# Patient Record
Sex: Male | Born: 1955 | Race: White | Hispanic: No | Marital: Married | Smoking: Former smoker
Health system: Southern US, Community
[De-identification: ages and names within clinical notes are randomized; demographics above are authoritative.]

## PROBLEM LIST (undated history)

## (undated) DIAGNOSIS — D6859 Other primary thrombophilia: Secondary | ICD-10-CM

## (undated) DIAGNOSIS — R760 Raised antibody titer: Secondary | ICD-10-CM

## (undated) DIAGNOSIS — I81 Portal vein thrombosis: Secondary | ICD-10-CM

## (undated) HISTORY — DX: Other primary thrombophilia: D68.59

## (undated) HISTORY — PX: COLON SURGERY: SHX602

## (undated) HISTORY — DX: Raised antibody titer: R76.0

## (undated) HISTORY — DX: Portal vein thrombosis: I81

---

## 2003-09-02 ENCOUNTER — Emergency Department (HOSPITAL_COMMUNITY): Admission: EM | Admit: 2003-09-02 | Discharge: 2003-09-02 | Payer: Self-pay | Admitting: Family Medicine

## 2003-09-17 ENCOUNTER — Emergency Department (HOSPITAL_COMMUNITY): Admission: EM | Admit: 2003-09-17 | Discharge: 2003-09-17 | Payer: Self-pay | Admitting: Emergency Medicine

## 2003-09-18 ENCOUNTER — Inpatient Hospital Stay (HOSPITAL_COMMUNITY): Admission: AD | Admit: 2003-09-18 | Discharge: 2003-09-20 | Payer: Self-pay | Admitting: Orthopaedic Surgery

## 2003-09-21 ENCOUNTER — Encounter (HOSPITAL_COMMUNITY): Admission: RE | Admit: 2003-09-21 | Discharge: 2003-10-21 | Payer: Self-pay | Admitting: Orthopaedic Surgery

## 2003-11-06 ENCOUNTER — Emergency Department (HOSPITAL_COMMUNITY): Admission: EM | Admit: 2003-11-06 | Discharge: 2003-11-06 | Payer: Self-pay | Admitting: Emergency Medicine

## 2003-12-29 ENCOUNTER — Emergency Department (HOSPITAL_COMMUNITY): Admission: EM | Admit: 2003-12-29 | Discharge: 2003-12-29 | Payer: Self-pay | Admitting: Emergency Medicine

## 2004-02-10 ENCOUNTER — Emergency Department (HOSPITAL_COMMUNITY): Admission: EM | Admit: 2004-02-10 | Discharge: 2004-02-10 | Payer: Self-pay | Admitting: Family Medicine

## 2011-09-24 ENCOUNTER — Emergency Department (HOSPITAL_COMMUNITY): Payer: BC Managed Care – PPO

## 2011-09-24 ENCOUNTER — Encounter (HOSPITAL_COMMUNITY): Payer: Self-pay | Admitting: *Deleted

## 2011-09-24 ENCOUNTER — Inpatient Hospital Stay (HOSPITAL_COMMUNITY)
Admission: EM | Admit: 2011-09-24 | Discharge: 2011-09-27 | DRG: 557 | Disposition: A | Discharge status: Home / Self Care | Payer: BC Managed Care – PPO | Attending: Family Medicine | Admitting: Family Medicine

## 2011-09-24 DIAGNOSIS — Z87891 Personal history of nicotine dependence: Secondary | ICD-10-CM

## 2011-09-24 DIAGNOSIS — R7309 Other abnormal glucose: Secondary | ICD-10-CM | POA: Diagnosis present

## 2011-09-24 DIAGNOSIS — I81 Portal vein thrombosis: Principal | ICD-10-CM | POA: Diagnosis present

## 2011-09-24 DIAGNOSIS — K55059 Acute (reversible) ischemia of intestine, part and extent unspecified: Secondary | ICD-10-CM | POA: Diagnosis present

## 2011-09-24 DIAGNOSIS — D6859 Other primary thrombophilia: Secondary | ICD-10-CM | POA: Diagnosis present

## 2011-09-24 DIAGNOSIS — Z86711 Personal history of pulmonary embolism: Secondary | ICD-10-CM

## 2011-09-24 LAB — COMPREHENSIVE METABOLIC PANEL
Alkaline Phosphatase: 50 U/L (ref 39–117)
Chloride: 102 mEq/L (ref 96–112)
GFR calc Af Amer: >90 mL/min (ref >90)
GFR calc non Af Amer: >90 mL/min (ref >90)
Glucose, Bld: 156 mg/dL — ABNORMAL HIGH (ref 70–99)
Sodium: 140 mEq/L (ref 135–145)
Total Protein: 7.4 g/dL (ref 6.0–8.3)

## 2011-09-24 LAB — CBC WITH DIFFERENTIAL/PLATELET
Basophils Absolute: 0 10*3/uL (ref 0.0–0.1)
Eosinophils Absolute: 0.1 10*3/uL (ref 0.0–0.7)
Hemoglobin: 15.1 g/dL (ref 13.0–17.0)
Lymphs Abs: 1.6 10*3/uL (ref 0.7–4.0)
Monocytes Relative: 7 % (ref 3–12)
Neutrophils Relative %: 73 % (ref 43–77)
Platelets: 206 10*3/uL (ref 150–400)
RDW: 12.7 % (ref 11.5–15.5)

## 2011-09-24 LAB — URINALYSIS, ROUTINE W REFLEX MICROSCOPIC
Bilirubin Urine: NEGATIVE
Leukocytes, UA: NEGATIVE
Nitrite: NEGATIVE
Specific Gravity, Urine: >1.03 — ABNORMAL HIGH (ref 1.005–1.030)

## 2011-09-24 LAB — MRSA PCR SCREENING: MRSA by PCR: NEGATIVE

## 2011-09-24 MED ORDER — SODIUM CHLORIDE 0.9 % IV BOLUS (SEPSIS)
500.0000 mL | Freq: Once | INTRAVENOUS | Status: AC
  Administered 2011-09-24: 11:00:00 via INTRAVENOUS

## 2011-09-24 MED ORDER — SODIUM CHLORIDE 0.9 % IV SOLN: INTRAVENOUS | Status: AC

## 2011-09-24 MED ORDER — HYDROMORPHONE HCL PF 1 MG/ML IJ SOLN
1.0000 mg | INTRAMUSCULAR | Status: AC | PRN
  Administered 2011-09-24 – 2011-09-25 (×3): 1 mg via INTRAVENOUS
  Filled 2011-09-24 (×3): qty 1

## 2011-09-24 MED ORDER — IBUPROFEN 800 MG PO TABS
400.0000 mg | ORAL_TABLET | Freq: Four times a day (QID) | ORAL | Status: DC | PRN
  Administered 2011-09-25 – 2011-09-26 (×5): 400 mg via ORAL
  Filled 2011-09-24 (×5): qty 1

## 2011-09-24 MED ORDER — ENOXAPARIN SODIUM 150 MG/ML ~~LOC~~ SOLN
1.5000 mg/kg | Freq: Once | SUBCUTANEOUS | Status: AC
  Administered 2011-09-24: 140 mg via SUBCUTANEOUS
  Filled 2011-09-24: qty 1

## 2011-09-24 MED ORDER — ENOXAPARIN SODIUM 150 MG/ML ~~LOC~~ SOLN
140.0000 mg | SUBCUTANEOUS | Status: DC
  Administered 2011-09-25 – 2011-09-27 (×3): 140 mg via SUBCUTANEOUS
  Filled 2011-09-24 (×3): qty 1

## 2011-09-24 MED ORDER — MORPHINE SULFATE 4 MG/ML IJ SOLN
4.0000 mg | Freq: Once | INTRAMUSCULAR | Status: AC
  Administered 2011-09-24: 4 mg via INTRAVENOUS
  Filled 2011-09-24: qty 1

## 2011-09-24 MED ORDER — ONDANSETRON HCL 4 MG/2ML IJ SOLN: 4.0000 mg | Freq: Three times a day (TID) | INTRAMUSCULAR | Status: AC | PRN

## 2011-09-24 NOTE — ED Notes (Signed)
Patient denies pain and is resting comfortably.  

## 2011-09-24 NOTE — ED Notes (Signed)
abd pain for 2 weeks. No  NVD,  Used a laxative without  Sl results.

## 2011-09-24 NOTE — H&P (Signed)
Eric Becker, Eric Becker             ACCOUNT NO.:  [id number]  MEDICAL RECORD NO.:  [id number]  LOCATION:  A320                          FACILITY:  [facility]  PHYSICIAN:  Eric Tenny G. Renard Matter, MD   DATE OF BIRTH:  01/15/1956  DATE OF ADMISSION:  09/24/2011 DATE OF DISCHARGE:  LH                             HISTORY & PHYSICAL   A 56 year old male patient presented to the emergency room with a history of abdominal pain which was more severe over the last 2 days. He had been somewhat diaphoretic, had taken the pain medication prior to admission.  He was evaluated by ED physician.  Ultrasound was obtained of upper abdomen which showed evidence of 0.3 cm gallstone versus polyp in a right portal vein thrombosis.  The patient was given pain medication in the emergency department and given 140 mg of Lovenox subcutaneously.  Subsequently was admitted.  SOCIAL HISTORY:  The patient was a former cigarette smoker and does use alcohol.  FAMILY HISTORY:  Positive for diabetes with mother.  PREVIOUS SURGERY:  The patient had previous colon surgery.  The patient apparently has no chronic medical illnesses.  No known allergies.  REVIEW OF SYSTEMS:  HEENT:  Negative.  CARDIOPULMONARY:  No cough, hemoptysis or dyspnea.  GI:  The patient has had mid abdominal pain and upper abdominal pain, but no nausea, vomiting, diarrhea, or bleeding. GU:  No dysuria or hematuria.  PHYSICAL EXAMINATION:  GENERAL:  Well-developed male. VITAL SIGNS:  Blood pressure 136/83, respirations 18, pulse 95, temp 99. HEENT:  Eyes, PERRLA.  TM negative.  Oropharynx benign. NECK:  Supple.  No JVD or thyroid abnormalities. HEART:  Regular rhythm.  No murmurs. LUNGS:  Clear to P and A. ABDOMEN:  Mid abdominal tenderness and epigastric tenderness.  No organomegaly.  External genitalia is normal. EXTREMITIES:  Free of edema. NEUROLOGIC:  No motor or sensory abnormalities in extremities.  Cranial nerves intact.  ASSESSMENT:  The  patient was admitted with abdominal pain, found to have a small stone or polyp in the gallbladder and a portal vein thrombosis.  PLAN:  To continue IV fluids.  Continue daily Lovenox injection.  We will prescribe Dilaudid as needed for pain.     Eric Vidas G. Renard Matter, MD     AGM/MEDQ  D:  09/24/2011  T:  09/24/2011  Job:  [id number]

## 2011-09-24 NOTE — ED Provider Notes (Signed)
History  This chart was scribed for Benny Lennert, MD by Erskine Emery. This patient was seen in room APA09/APA09 and the patient's care was started at 11:07.   CSN: [id number]  Arrival date & time 09/24/11  1054   First MD Initiated Contact with Patient 09/24/11 1107      Chief Complaint  Patient presents with  . Abdominal Pain    (Consider location/radiation/quality/duration/timing/severity/associated sxs/prior treatment) Patient is a 56 y.o. male presenting with abdominal pain. The history is provided by the patient and a relative. No language interpreter was used.  Abdominal Pain The primary symptoms of the illness include abdominal pain. The primary symptoms of the illness do not include fever, fatigue, nausea, vomiting or diarrhea. The current episode started more than 2 days ago (for the last 2 weeks). The onset of the illness was gradual. The problem has been gradually worsening.  The abdominal pain began more than 2 days ago. The abdominal pain has been gradually worsening since its onset. The abdominal pain is generalized. Relieved by: Pt reports taking pain medication last night with mild relief from pain.  Additional symptoms associated with the illness include diaphoresis. Symptoms associated with the illness do not include chills, hematuria, frequency or back pain.  Pt reports using a laxative with no SI results.  History reviewed. No pertinent past medical history.  Past Surgical History  Procedure Date  . Colon surgery     Family History  Problem Relation Age of Onset  . Diabetes Mother     History  Substance Use Topics  . Smoking status: Former Games developer  . Smokeless tobacco: Not on file  . Alcohol Use: Yes     occ      Review of Systems  Constitutional: Positive for diaphoresis. Negative for fever, chills and fatigue.  HENT: Negative for congestion, neck pain, sinus pressure and ear discharge.   Eyes: Negative for discharge.  Respiratory: Negative for  cough.   Cardiovascular: Negative for chest pain.  Gastrointestinal: Positive for abdominal pain. Negative for nausea, vomiting and diarrhea.  Genitourinary: Negative for frequency and hematuria.  Musculoskeletal: Negative for back pain.  Skin: Negative for rash.  Neurological: Negative for seizures and headaches.  Hematological: Negative.   Psychiatric/Behavioral: Positive for disturbed wake/sleep cycle. Negative for hallucinations.    Allergies  Review of patient's allergies indicates no known allergies.  Home Medications  No current outpatient prescriptions on file.  Triage Vitals: BP 136/90  Pulse 105  Temp 98.6 F (37 C) (Oral)  Resp 18  Ht 5' 9.5" (1.765 m)  Wt 204 lb (92.534 kg)  BMI 29.69 kg/m2  SpO2 97%  Physical Exam  Constitutional: He is oriented to person, place, and time. He appears well-developed.  HENT:  Head: Normocephalic and atraumatic.  Eyes: Conjunctivae and EOM are normal. No scleral icterus.  Neck: Neck supple. No thyromegaly present.  Cardiovascular: Normal rate and regular rhythm.  Exam reveals no gallop and no friction rub.   No murmur heard. Pulmonary/Chest: No stridor. He has no wheezes. He has no rales. He exhibits no tenderness.  Abdominal: He exhibits no distension. There is tenderness. There is no rebound.       Mild RUQ tenderness  Musculoskeletal: Normal range of motion. He exhibits no edema.  Lymphadenopathy:    He has no cervical adenopathy.  Neurological: He is oriented to person, place, and time. Coordination normal.  Skin: No rash noted. No erythema.       Well-healed abdominal incision  Psychiatric: He has a normal mood and affect. His behavior is normal.    ED Course  Procedures (including critical care time)  DIAGNOSTIC STUDIES: Oxygen Saturation is 97% on room air, adequate by my interpretation.    COORDINATION OF CARE:  11:16--I discussed treatment plan including urinalysis, blood work, and radiology with pt and pt  agreed.  11:30--Medication Orders: Sodium chloride 0.9% bolus 500 mL--once.  Labs Reviewed - No data to display No results found.   No diagnosis found.    MDM   I personally performed the services described in this documentation, which was scribed in my presence. The recorded information has been reviewed and considered.  The chart was scribed for me under my direct supervision.  I personally performed the history, physical, and medical decision making and all procedures in the evaluation of this patient.Benny Lennert, MD 09/24/11 [phone]

## 2011-09-24 NOTE — Progress Notes (Signed)
ANTICOAGULATION CONSULT NOTE - Initial Consult  Pharmacy Consult for Lovenox Indication: portal vein thrombus  No Known Allergies  Patient Measurements: Height: 5' 9.5" (176.5 cm) Weight: 204 lb (92.534 kg) IBW/kg (Calculated) : 71.85   Vital Signs: Temp: 99 F (37.2 C) (07/18 1621) Temp src: Oral (07/18 1621) BP: 136/83 mmHg (07/18 1621) Pulse Rate: 95  (07/18 1621)  Labs:  Basename 09/24/11 1130  HGB 15.1  HCT 44.2  PLT 206  APTT --  LABPROT --  INR --  HEPARINUNFRC --  CREATININE 0.96  CKTOTAL --  CKMB --  TROPONINI --    Estimated Creatinine Clearance: 99.7 ml/min (by C-G formula based on Cr of 0.96).   Medical History: History reviewed. No pertinent past medical history.  Medications:  Scheduled:    . sodium chloride   Intravenous STAT  . enoxaparin (LOVENOX) injection  1.5 mg/kg Subcutaneous Once  . enoxaparin (LOVENOX) injection  140 mg Subcutaneous Q24H  . morphine  4 mg Intravenous Once  . sodium chloride  500 mL Intravenous Once    Assessment: 56 yo M admitted with portal vein thrombus.  Renal function at baseline. No bleeding noted.   Goal of Therapy:  Monitor platelets by anticoagulation protocol: Yes   Plan:  1) Lovenox 1.5mg /kg sq Q24h as discussed with Dr Renard Matter 2) CBC q3days 3) Follow-up plans for long-term anticoagulation  Elson Clan 09/24/2011,4:52 PM

## 2011-09-25 ENCOUNTER — Inpatient Hospital Stay (HOSPITAL_COMMUNITY): Payer: BC Managed Care – PPO

## 2011-09-25 DIAGNOSIS — I81 Portal vein thrombosis: Principal | ICD-10-CM

## 2011-09-25 LAB — HOMOCYSTEINE: Homocysteine: 8 umol/L (ref 4.0–15.4)

## 2011-09-25 LAB — CBC
Hemoglobin: 13.7 g/dL (ref 13.0–17.0)
MCHC: 33.1 g/dL (ref 30.0–36.0)
RBC: 4.64 MIL/uL (ref 4.22–5.81)
RDW: 12.7 % (ref 11.5–15.5)

## 2011-09-25 LAB — FIBRINOGEN: Fibrinogen: 612 mg/dL — ABNORMAL HIGH (ref 204–475)

## 2011-09-25 LAB — D-DIMER, QUANTITATIVE: D-Dimer, Quant: 4.89 ug/mL-FEU — ABNORMAL HIGH (ref 0.00–0.48)

## 2011-09-25 MED ORDER — SODIUM CHLORIDE 0.9 % IV SOLN
INTRAVENOUS | Status: DC
  Administered 2011-09-26 (×2): via INTRAVENOUS

## 2011-09-25 MED ORDER — SODIUM CHLORIDE 0.9 % IJ SOLN
INTRAMUSCULAR | Status: AC
  Administered 2011-09-25: 10 mL
  Filled 2011-09-25: qty 3

## 2011-09-25 MED ORDER — IOHEXOL 300 MG/ML  SOLN
100.0000 mL | Freq: Once | INTRAMUSCULAR | Status: AC | PRN
  Administered 2011-09-25: 100 mL via INTRAVENOUS

## 2011-09-25 NOTE — Care Management Note (Unsigned)
    Page 1 of 1   09/25/2011     1:24:05 PM   CARE MANAGEMENT NOTE 09/25/2011  Patient:  MCKOY, BHAKTA   Account Number:  [id number]  Date Initiated:  09/25/2011  Documentation initiated by:  Sharrie Rothman  Subjective/Objective Assessment:   Pt admitted from home with portal vein thrombosis. Pt lives with wife and will return home at discharge. Pt is independent with ADL's.     Action/Plan:   CM spoke with pt about possible need for outpt lovenox. Pt would like to use something else for coagulation if possible. Benefits check requested to see what cost will be related to pt for lovenox.   Anticipated DC Date:  09/28/2011   Anticipated DC Plan:  HOME/SELF CARE      DC Planning Services  CM consult      Choice offered to / List presented to:             Status of service:  In process, will continue to follow Medicare Important Message given?   (If response is "NO", the following Medicare IM given date fields will be blank) Date Medicare IM given:   Date Additional Medicare IM given:    Discharge Disposition:    Per UR Regulation:    If discussed at Long Length of Stay Meetings, dates discussed:    Comments:  09/25/11 1323 Arlyss Queen, RN BSN CM

## 2011-09-25 NOTE — Consult Note (Signed)
[facility] Consultation Oncology  Name: Eric Becker      MRN: [id number]    Location: A320/A320-01  Date: 09/25/2011 Time:2:28 PM   REFERRING PHYSICIAN:  John Giovanni, MD  REASON FOR CONSULT:  Portal vein thrombosis  HISTORY OF PRESENT ILLNESS:   This is a 56 year old Caucasian man who presented to the ED yesterday with 2 days worth of abdominal pain.  He reported to the ED.  In the ED, lab work was performed that was unimpressive.  An [place] of abdomen limited was completed which revealed a right portal vein thrombus and 0.3 cm nonmobile gallstone versus polyp.  He was admitted for further management of thrombus. No obvious liver cirrhosis noted.   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  He reverberates the story mentioned above.  He appears to be a healthy man and does not take any medications at home except an 81 mg ASA which he takes on his own without any particular reason.  He reports that his abdominal pain has resolved since starting inpatient lovenox. He is on 140 mg of Lovenox every 24 hours.  This was started on 09/24/2011 at 1525 hrs.  He correlates his improved abominal pain to the initiation of Lovenox.   He also has dilaudid ordered and it was last given today at 0146 hours. He explains that he had a blood clot postoperatively 10 years ago following an abdominal surgery by Dr. Lovell Sheehan.  He thinks the blood clot was in his lungs.    He denies any fevers, chills, night sweats, headaches, dizziness, double vision, nausea, vomiting, constipation, diarrhea, abdominal pain, chest pain, heart palpitations, blood in stool, black tarry stool, hematuria, urinary pain/burning/frequency, jaundice, SOB.  The patient has an interesting family history.  Please review that for more details in the note below.    PAST MEDICAL HISTORY:   History reviewed. No pertinent past medical history.  ALLERGIES: No Known Allergies    MEDICATIONS: I have reviewed the patient's  current medications.     PAST SURGICAL HISTORY Past Surgical History  Procedure Date  . Colon surgery     FAMILY HISTORY: Family History  Problem Relation Age of Onset  . Diabetes Mother    The patient's grandfather had blood clots in the setting of cancer.  The patient's mother is 76 years old and she has hypercholesterolemia and HTN.  His father passed away at the age of 13 from lung cancer, he was a smoker.  He has 2 brother and one sister.  His 82 year old brother is alive and well.  His 38 year old brother has antiphospholipid antibody syndrome.  He does not know how he is/was treated for this.  His sister is 76 and she is alive and well.  He has 2 sons and 1 daughter.  The daughter is 56 years old and she is healthy.  His 42 year old son is healthy and his 67 year old son has a history of B/L PE 5 years ago without an initiating factor.  He reports that he is "lazy and immobile."  SOCIAL HISTORY:  reports that he quit smoking about 7 months ago. He does not have any smokeless tobacco history on file. He reports that he drinks alcohol. He reports that he does not use illicit drugs.  The patient was born in [place], [place] and now lives in [place], [place].  He completed 11th grade of high school.  He is a Occupational hygienist for a  corporation.  He has been married for 33 years.  He lives with his wife, daughter, and younger son at home.  He admits to occasional EtOH beverage.  He drinks once or twice per month and only has 2-3 beers at these occassions.  He admits to a smoking history of 1 ppd x 18 years.  He denies illicit drug abuse.   PERFORMANCE STATUS: The patient's performance status is 1 - Symptomatic but completely ambulatory  PHYSICAL EXAM: Most Recent Vital Signs: Blood pressure 111/75, pulse 79, temperature 99.1 F (37.3 C), temperature source Oral, resp. rate 18, height 5' 9.5" (1.765 m), weight 204 lb (92.534 kg), SpO2 95.00%. General appearance: alert, cooperative, appears stated age, no  distress and pleasant Head: Normocephalic, without obvious abnormality, atraumatic Eyes: negative findings: conjunctivae and sclerae normal and pupils equal, round, reactive to light and accomodation Neck: supple, symmetrical, trachea midline Back: no skin lesions, erythema, or scars, no tenderness to percussion or palpation, symmetric, no curvature. ROM normal. No CVA tenderness. Lungs: clear to auscultation bilaterally and normal percussion bilaterally Chest wall: no tenderness, no collateral veins appreciated Heart: regular rate and rhythm, S1, S2 normal, no murmur, click, rub or gallop Abdomen: abnormal findings:  moderate tenderness in the RUQ.  No collateral veins appreciated.  No spider angiomas.  + BS x 4 quadrants.  No hepatosplenomegaly appreciated clinically.  Extremities: extremities normal, atraumatic, no cyanosis or edema Pulses: 2+ and symmetric Skin: Skin color, texture, turgor normal. No rashes or lesions Neurologic: Alert and oriented X 3, normal strength and tone. Normal symmetric reflexes. Normal coordination and gait  LABORATORY DATA:  Results for orders placed during the hospital encounter of 09/24/11 (from the past 48 hour(s))  URINALYSIS, ROUTINE W REFLEX MICROSCOPIC     Status: Abnormal   Collection Time   09/24/11 11:22 AM      Component Value Range Comment   Color, Urine YELLOW  YELLOW    APPearance CLEAR  CLEAR    Specific Gravity, Urine >1.030 (*) 1.005 - 1.030    pH 6.0  5.0 - 8.0    Glucose, UA NEGATIVE  NEGATIVE mg/dL    Hgb urine dipstick MODERATE (*) NEGATIVE    Bilirubin Urine NEGATIVE  NEGATIVE    Ketones, ur NEGATIVE  NEGATIVE mg/dL    Protein, ur NEGATIVE  NEGATIVE mg/dL    Urobilinogen, UA 0.2  0.0 - 1.0 mg/dL    Nitrite NEGATIVE  NEGATIVE    Leukocytes, UA NEGATIVE  NEGATIVE   URINE MICROSCOPIC-ADD ON     Status: Normal   Collection Time   09/24/11 11:22 AM      Component Value Range Comment   Squamous Epithelial / LPF RARE  RARE    RBC / HPF  0-2  <3 RBC/hpf   CBC WITH DIFFERENTIAL     Status: Normal   Collection Time   09/24/11 11:30 AM      Component Value Range Comment   WBC 8.4  4.0 - 10.5 K/uL    RBC 4.98  4.22 - 5.81 MIL/uL    Hemoglobin 15.1  13.0 - 17.0 g/dL    HCT 16.1  09.6 - 04.5 %    MCV 88.8  78.0 - 100.0 fL    MCH 30.3  26.0 - 34.0 pg    MCHC 34.2  30.0 - 36.0 g/dL    RDW 40.9  81.1 - 91.4 %    Platelets 206  150 - 400 K/uL    Neutrophils Relative 73  43 - 77 %    Neutro Abs 6.1  1.7 - 7.7 K/uL    Lymphocytes Relative 19  12 - 46 %    Lymphs Abs 1.6  0.7 - 4.0 K/uL    Monocytes Relative 7  3 - 12 %    Monocytes Absolute 0.6  0.1 - 1.0 K/uL    Eosinophils Relative 2  0 - 5 %    Eosinophils Absolute 0.1  0.0 - 0.7 K/uL    Basophils Relative 0  0 - 1 %    Basophils Absolute 0.0  0.0 - 0.1 K/uL   COMPREHENSIVE METABOLIC PANEL     Status: Abnormal   Collection Time   09/24/11 11:30 AM      Component Value Range Comment   Sodium 140  135 - 145 mEq/L    Potassium 4.0  3.5 - 5.1 mEq/L    Chloride 102  96 - 112 mEq/L    CO2 28  19 - 32 mEq/L    Glucose, Bld 156 (*) 70 - 99 mg/dL    BUN 14  6 - 23 mg/dL    Creatinine, Ser 4.09  0.50 - 1.35 mg/dL    Calcium 9.6  8.4 - 81.1 mg/dL    Total Protein 7.4  6.0 - 8.3 g/dL    Albumin 3.4 (*) 3.5 - 5.2 g/dL    AST 24  0 - 37 U/L    ALT 39  0 - 53 U/L    Alkaline Phosphatase 50  39 - 117 U/L    Total Bilirubin 0.7  0.3 - 1.2 mg/dL    GFR calc non Af Amer >90  >90 mL/min    GFR calc Af Amer >90  >90 mL/min   LIPASE, BLOOD     Status: Normal   Collection Time   09/24/11 11:30 AM      Component Value Range Comment   Lipase 18  11 - 59 U/L   MRSA PCR SCREENING     Status: Normal   Collection Time   09/24/11  4:48 PM      Component Value Range Comment   MRSA by PCR NEGATIVE  NEGATIVE   CBC     Status: Normal   Collection Time   09/25/11  5:21 AM      Component Value Range Comment   WBC 8.4  4.0 - 10.5 K/uL    RBC 4.64  4.22 - 5.81 MIL/uL    Hemoglobin 13.7  13.0  - 17.0 g/dL    HCT 91.4  78.2 - 95.6 %    MCV 89.2  78.0 - 100.0 fL    MCH 29.5  26.0 - 34.0 pg    MCHC 33.1  30.0 - 36.0 g/dL    RDW 21.3  08.6 - 57.8 %    Platelets 223  150 - 400 K/uL       RADIOGRAPHY: US Abdomen Limited Ruq  09/24/2011  *RADIOLOGY REPORT*  Clinical Data:  Abdominal pain.  LIMITED ABDOMINAL ULTRASOUND - RIGHT UPPER QUADRANT  Comparison:  None.  Findings:  Gallbladder:  There is a 0.3 cm echogenic focus in the gallbladder neck which could be a tiny stone or polyp.  No pericholecystic fluid or wall thickening is identified.  Sonographer reports negative Murphy's sign.  Common bile duct:  Measures 0.3 cm.  Liver:  There appears to be clot within the right portal vein.  No focal liver lesion is identified.  IMPRESSION:  1.  Likely thrombosis  of the right portal vein. 2.  0.3 cm nonmobile gallstone versus polyp.                   Original Report Authenticated By: Bernadene Bell. D'ALESSIO, M.D.      ASSESSMENT:  1. Right portal vein thrombosis 2. ?Gallstones 3. H/O PE postoperatively 10 years ago. 4. Family history of hypercoagulable state 5. Abdominal pain, resolved.  6. RUQ abdominal tenderness on Physical exam.   PLAN:  1. I personally reviewed and went over laboratory results with the patient. 2. I personally reviewed and went over radiographic studies with the patient. 3. Lab work today: Hypercoagulable panel, PT/INR, aPTT, D-Dimer, Fibrinogen 4. CT abd/pelvis with contrast to evaluate acute versus chronic portal vein thrombosis, evaluate for cirrhosis of liver, evaluate for occult malignancy. 5. Continue with Lovenox until further recommendation by Dr. Mariel Sleet. 6. Patient should follow-up with [facility] in 2 weeks if discharged over the weekend to review results.  If deemed necessary (which I will defer to Dr. Mariel Sleet) patient may require bridging to Coumadin and then management of Coumadin which can be done by PCP or the [facility].    All questions were answered. The patient knows to call the clinic with any problems, questions or concerns. We can certainly see the patient much sooner if necessary.  The patient and plan discussed with Glenford Peers, MD and he is in agreement with the aforementioned.  Darren Caldron

## 2011-09-25 NOTE — Progress Notes (Signed)
NAMERAYEN, DAFOE             ACCOUNT NO.:  [id number]  MEDICAL RECORD NO.:  [id number]  LOCATION:  A320                          FACILITY:  [facility]  PHYSICIAN:  Mila Homer. Sudie Bailey, M.D.DATE OF BIRTH:  17-Mar-1955  DATE OF PROCEDURE:  09/25/2011 DATE OF DISCHARGE:                                PROGRESS NOTE   SUBJECTIVE:  This 56 year old was admitted yesterday with portal vein thrombosis.  He did have a pulmonary embolus years ago after surgery and his son also has a history of what sounds like hypercoagulable syndrome in his 33s.  He is still having some abdominal pain.  OBJECTIVE:  VITAL SIGNS:  Temperature is 99.1, pulse 79, respiratory rate 18, blood pressure 111/75. GENERAL:  He is semi-recumbent in bed.  He is well developed, well nourished, oriented, alert. HEART:  Regular rhythm, rate of about 70. LUNGS:  Clear throughout. ABDOMEN:  Soft without organomegaly or mass or tenderness, but there is an area of tenderness in the right upper quadrant.  He has a small surgical scar measuring not more than 2 or 3 inches in midline of his abdomen.  ASSESSMENT: 1. Probable hypercoagulable syndrome. 2. Status post gastrointestinal surgery.  PLAN:  He is currently on Lovenox.  I asked Hematology Oncology to see him in consult.  Meanwhile, continue IV fluids and pain meds.     Mila Homer. Sudie Bailey, M.D.     SDK/MEDQ  D:  09/25/2011  T:  09/25/2011  Job:  [id number]

## 2011-09-25 NOTE — Progress Notes (Signed)
UR Chart Review Completed  

## 2011-09-26 LAB — PROTEIN C, TOTAL: Protein C, Total: 89 % (ref 72–160)

## 2011-09-26 LAB — PROTEIN S, TOTAL: Protein S Ag, Total: 81 % (ref 60–150)

## 2011-09-26 MED ORDER — WARFARIN SODIUM 7.5 MG PO TABS
7.5000 mg | ORAL_TABLET | Freq: Once | ORAL | Status: AC
  Administered 2011-09-26: 7.5 mg via ORAL
  Filled 2011-09-26 (×2): qty 1

## 2011-09-26 MED ORDER — DIPHENHYDRAMINE HCL 25 MG PO CAPS
25.0000 mg | ORAL_CAPSULE | Freq: Every evening | ORAL | Status: DC | PRN
  Administered 2011-09-26: 25 mg via ORAL
  Filled 2011-09-26: qty 1

## 2011-09-26 MED ORDER — WARFARIN - PHARMACIST DOSING INPATIENT: Freq: Every day | Status: DC

## 2011-09-26 NOTE — Progress Notes (Signed)
NAMELEMUEL, BOODRAM             ACCOUNT NO.:  [id number]  MEDICAL RECORD NO.:  [id number]  LOCATION:  A320                          FACILITY:  [facility]  PHYSICIAN:  Mila Homer. Sudie Bailey, M.D.DATE OF BIRTH:  1955/05/13  DATE OF PROCEDURE:  09/26/2011 DATE OF DISCHARGE:                                PROGRESS NOTE   SUBJECTIVE:  His abdomen does feel somewhat better.  He has been able to cut back on the use of pain medication.  OBJECTIVE:  VITAL SIGNS:  Temperature is 98 degrees, pulse 71, respiratory rate 20, blood pressure 127/76. GENERAL:  His wife and daughter are in the room with him.  He looks like he is in no acute distress. HEART:  Heart has a regular rhythm.  Rate of about 70. LUNGS:  Lungs appear clear throughout and he has minimal tenderness on palpation of the abdomen.  His CT scan of the abdomen and pelvis done yesterday showed the right portal vein thrombosis that was also indicated in his ultrasound the day before and also there was thrombosis of the superior mesenteric vein and its branches with associated mesenteric edema and congestion.  He also had several small non-obstructing midline abdominal hernias.  ASSESSMENT: 1. 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 2. Hypercoagulable syndrome.  PLAN:  I reviewed the note from hematology oncology.  He will be continued on Lovenox, but started on warfarin today, and possibly discharged home tomorrow, with follow up outpatient.     Mila Homer. Sudie Bailey, M.D.     SDK/MEDQ  D:  09/26/2011  T:  09/26/2011  Job:  [id number]

## 2011-09-26 NOTE — Progress Notes (Signed)
ANTICOAGULATION CONSULT NOTE - Initial Consult  Pharmacy Consult for Lovenox and Coumadin Indication: portal vein thrombus  No Known Allergies  Patient Measurements: Height: 5' 9.5" (176.5 cm) Weight: 204 lb (92.534 kg) IBW/kg (Calculated) : 71.85   Vital Signs: Temp: 98 F (36.7 C) (07/20 0546) Temp src: Oral (07/20 0546) BP: 127/76 mmHg (07/20 0546) Pulse Rate: 71  (07/20 0546)  Labs:  Basename 09/25/11 1545 09/25/11 0521 09/24/11 1130  HGB -- 13.7 15.1  HCT -- 41.4 44.2  PLT -- 223 206  APTT 39* -- --  LABPROT 13.9 -- --  INR 1.05 -- --  HEPARINUNFRC -- -- --  CREATININE -- -- 0.96  CKTOTAL -- -- --  CKMB -- -- --  TROPONINI -- -- --   Estimated Creatinine Clearance: 99.7 ml/min (by C-G formula based on Cr of 0.96).  Medical History: History reviewed. No pertinent past medical history.  Medications:  Scheduled:     . sodium chloride   Intravenous STAT  . enoxaparin (LOVENOX) injection  140 mg Subcutaneous Q24H  . sodium chloride       Assessment: 56 yo M admitted with portal vein thrombus.  Renal function at baseline. No bleeding noted.   Goal of Therapy:  Monitor platelets by anticoagulation protocol: Yes   Plan: Coumadin 7.5gm today x 1 Lovenox 1.5mg /kg sq Q24h as discussed with Dr Renard Matter CBC q3days INR daily  Valrie Hart A 09/26/2011,2:16 PM

## 2011-09-27 LAB — PROTIME-INR
INR: 1.03 (ref 0.00–1.49)
Prothrombin Time: 13.7 seconds (ref 11.6–15.2)

## 2011-09-27 MED ORDER — WARFARIN SODIUM 5 MG PO TABS: ORAL_TABLET | ORAL | Status: DC

## 2011-09-27 MED ORDER — WARFARIN SODIUM 7.5 MG PO TABS: 7.5000 mg | ORAL_TABLET | Freq: Once | ORAL | Status: DC

## 2011-09-27 MED ORDER — HYDROCODONE-ACETAMINOPHEN 5-500 MG PO CAPS: 1.0000 | ORAL_CAPSULE | ORAL | Status: AC | PRN

## 2011-09-27 MED ORDER — ENOXAPARIN SODIUM 100 MG/ML ~~LOC~~ SOLN: 140.0000 mg | SUBCUTANEOUS | Status: DC

## 2011-09-27 NOTE — Progress Notes (Signed)
Pt's IV removed.  Pt tolerated well.  Pt provided with discharge instructions.  Follow-up appt with Dr. Sudie Bailey on Tuesday, September 29, 2011.  Provided pt with three prescriptions.  Pt verbalized understanding of discharge instructions.  Pt's spouse to provide transportation home.

## 2011-09-27 NOTE — Progress Notes (Signed)
ANTICOAGULATION CONSULT NOTE   Pharmacy Consult for Lovenox and Coumadin Indication: portal vein thrombus  No Known Allergies  Patient Measurements: Height: 5' 9.5" (176.5 cm) Weight: 204 lb (92.534 kg) IBW/kg (Calculated) : 71.85   Vital Signs: Temp: 97.6 F (36.4 C) (07/21 0535) Temp src: Oral (07/21 0535) BP: 131/71 mmHg (07/21 0535) Pulse Rate: 73  (07/21 0535)  Labs:  Basename 09/27/11 0608 09/25/11 1545 09/25/11 0521 09/24/11 1130  HGB -- -- 13.7 15.1  HCT -- -- 41.4 44.2  PLT -- -- 223 206  APTT -- 39* -- --  LABPROT 13.7 13.9 -- --  INR 1.03 1.05 -- --  HEPARINUNFRC -- -- -- --  CREATININE -- -- -- 0.96  CKTOTAL -- -- -- --  CKMB -- -- -- --  TROPONINI -- -- -- --   Estimated Creatinine Clearance: 99.7 ml/min (by C-G formula based on Cr of 0.96).  Medical History: History reviewed. No pertinent past medical history.  Medications:  Scheduled:     . enoxaparin (LOVENOX) injection  140 mg Subcutaneous Q24H  . warfarin  7.5 mg Oral ONCE-1800  . warfarin  7.5 mg Oral ONCE-1800  . Warfarin - Pharmacist Dosing Inpatient   Does not apply q1800   Assessment: 56 yo M admitted with portal vein thrombus.  Renal function at baseline. No bleeding noted.   Goal of Therapy:  Monitor platelets by anticoagulation protocol: Yes INR 2-3   Plan: Coumadin 7.5gm today x 1 Lovenox 1.5mg /kg sq Q24h as discussed with MD CBC q3days INR daily  Valrie Hart A 09/27/2011,9:13 AM

## 2011-09-27 NOTE — Discharge Summary (Signed)
[name], Eric Becker             ACCOUNT NO.:  [id number]  MEDICAL RECORD NO.:  [id number]  LOCATION:  A320                          FACILITY:  [facility]  PHYSICIAN:  Mila Homer. Sudie Bailey, M.D.DATE OF BIRTH:  1955/05/02  DATE OF ADMISSION:  09/24/2011 DATE OF DISCHARGE:  07/21/2013LH                              DISCHARGE SUMMARY   This 56 year old was admitted to the hospital with abdominal pain which turned out to be secondary to a clot in the portal vein.  He had a benign 4-day hospitalization extending from July 18 to September 27, 2011. His vital signs remained stable.  His admission white cell count was 8400, hemoglobin 15.1, platelets 206,000.  Normal differential.  His CMP showed a glucose of 156 and albumin 3.4.  Recheck CBC was normal.  His homocystine level was 8.0.  His D-dimer was 4.89 and fibrinogen 612.  Antithrombin III was 75.  Protein C was 89, protein S antigen was 81, and PTT 39.  His UA was essentially negative. An MRSA by PCR negative.  His INR was 1.05, recheck 1.03, after he had just been started on warfarin.  Ultrasound of the abdomen showed a 0.3 cm echogenic focus in the gallbladder neck, which was felt to be a tiny stone or polyp but there was no pericholecystic fluid or wall thickening, and negative Murphy's sign.  Common bile duct 0.3 cm.  There was what appeared to be a clot within the right portal vein.  CT scan of the abdomen and pelvis with contrast showed right portal vein thrombosis and thrombosis of the superior mesenteric vein and its branches with associated mesenteric edema and congestion.  He had small nonobstructive midline abdominal hernias.  He was admitted to the hospital and put on Lovenox by injection, 140 mg daily.  He was on IV fluids, and eventually started on warfarin.  Abdominal pain gradually improved.  By the time of discharge, his abdominal pain was much improved.  He was eating fairly well, having normal bowel movements, and  being able to get up and around.  He was discharged home on Lovenox 1.4 mL of 100 mg/mL solution daily, hydrocodone/APAP 5/500 q.4 hours p.r.n. pain, and warfarin 5 mg 2 tablets day of discharge, 2 tablets the following day, with an INR to be checked in the office the day after that.  I discussed at length with him the use of warfarin, which is a drug that can be life saving but also life threatening.  He understands the need to be careful about cuts, bruises or head trauma.  He understands he will need frequent INRs checked.  After discharge he will see Dr. Mariel Sleet, hematologist oncologist, in consult to firm up the diagnosis that has caused this.  FINAL DISCHARGE DIAGNOSES: 1. 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 2. Possible hypercoagulable syndrome. 3. Hyperglycemia.  Also outpatient, he will have an A1c.     Mila Homer. Sudie Bailey, M.D.     SDK/MEDQ  D:  09/27/2011  T:  09/27/2011  Job:  [id number]

## 2011-09-28 LAB — PROTEIN C ACTIVITY: Protein C Activity: 157 % — ABNORMAL HIGH (ref 75–133)

## 2011-09-28 LAB — LUPUS ANTICOAGULANT PANEL
DRVVT: 66.6 secs — ABNORMAL HIGH (ref <45.1)
PTT Lupus Anticoagulant: 48.4 secs — ABNORMAL HIGH (ref 28.0–43.0)

## 2011-09-28 LAB — FACTOR 5 LEIDEN

## 2011-09-29 ENCOUNTER — Other Ambulatory Visit (HOSPITAL_COMMUNITY): Payer: Self-pay | Admitting: Oncology

## 2011-09-30 ENCOUNTER — Ambulatory Visit: Payer: Self-pay | Admitting: Gastroenterology

## 2011-09-30 ENCOUNTER — Other Ambulatory Visit (HOSPITAL_COMMUNITY): Payer: Self-pay | Admitting: Oncology

## 2011-10-01 LAB — BETA-2-GLYCOPROTEIN I ABS, IGG/M/A
Beta-2 Glyco I IgG: 0 G Units (ref <20)
Beta-2-Glycoprotein I IgA: 6 A Units (ref <20)
Beta-2-Glycoprotein I IgM: 53 M Units — ABNORMAL HIGH (ref <20)

## 2011-10-06 ENCOUNTER — Encounter (HOSPITAL_COMMUNITY): Payer: Self-pay | Admitting: Oncology

## 2011-10-06 ENCOUNTER — Encounter (HOSPITAL_COMMUNITY): Payer: BC Managed Care – PPO | Attending: Oncology | Admitting: Oncology

## 2011-10-06 VITALS — BP 126/76 | HR 86 | Temp 98.1°F | Ht 69.0 in | Wt 211.3 lb

## 2011-10-06 DIAGNOSIS — K55059 Acute (reversible) ischemia of intestine, part and extent unspecified: Secondary | ICD-10-CM

## 2011-10-06 DIAGNOSIS — D6859 Other primary thrombophilia: Secondary | ICD-10-CM

## 2011-10-06 DIAGNOSIS — R760 Raised antibody titer: Secondary | ICD-10-CM

## 2011-10-06 DIAGNOSIS — I81 Portal vein thrombosis: Secondary | ICD-10-CM

## 2011-10-06 HISTORY — DX: Other primary thrombophilia: D68.59

## 2011-10-06 HISTORY — DX: Raised antibody titer: R76.0

## 2011-10-06 NOTE — Patient Instructions (Addendum)
Eric Becker  [id number] 10-29-1955 Dr. Glenford Peers   [facility]  Discharge Instructions  RECOMMENDATIONS MADE BY THE CONSULTANT AND ANY TEST RESULTS WILL BE SENT TO YOUR REFERRING DOCTOR.   EXAM FINDINGS BY MD TODAY AND SIGNS AND SYMPTOMS TO REPORT TO CLINIC OR PRIMARY MD: Discussion by PA and MD  MEDICATIONS PRESCRIBED: none     SPECIAL INSTRUCTIONS/FOLLOW-UP: Return to Clinic as needed.   I acknowledge that I have been informed and understand all the instructions given to me and received a copy. I do not have any more questions at this time, but understand that I may call the [facility]ty [facility] at [facility] at [phone] during business hours should I have any further questions or need assistance in obtaining follow-up care.    __________________________________________  _____________  __________ Signature of Patient or Authorized Representative            Date                   Time    __________________________________________ Nurse's Signature

## 2011-10-06 NOTE — Progress Notes (Signed)
Milana Obey, MD [address] [place] [address]  1. Anticardiolipin antibody positive   2. Lupus anticoagulant positive   3. Hypercoagulable state     CURRENT THERAPY: Coumadin anticoagulation  INTERVAL HISTORY: Eric Becker 56 y.o. male returns for  regular  visit for followup of  portal vein thrombosis.  The patient was initially seen in consultation while as an inpatient. At that point time he presented to the emergency room with a 2 day history of abdominal pain. In the emergency department laboratory work was performed which was unimpressive. An ultrasound of the abdomen was completed which revealed a right portal vein thrombus and a 0.3 cm nonmobile gallstone versus polyp. He is admitted for further management and workup of his thrombus. No liver cirrhosis was appreciated.  I saw the patient and his room while in the hospital. Part of his continued workup that I performed include a hypercoagulable panel and a CT of abdomen pelvis with contrast. The CT of abdomen and pelvis did confirm a right portal vein thrombosis in addition to his superior mesenteric vein and its branches thrombus and associated mesenteric edema/congestion. No evidence for malignancy was appreciated. There was a prominent caudate lobe which can be associated with chronic changes.  So cirrhosis do not appear to be his cause of this portal vein thrombosis.  However, the hypercoagulable panel was quite impressive. His panel reveals an anti-cardiolipin IgM antibody, lupus anticoagulant, and a beta-2 glycoprotein 1 IgM.  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 As result of this, we encouraged and recommended the patient have lifelong anticoagulation. We discussed his to options for anticoagulation which includes Lovenox daily or Coumadin. We discussed the pros and cons of both. He is agreeable to have lifelong anticoagulation with Coumadin. His primary care physician  can monitor his INR. We recommended INR between 2-3.5. The patient asks that this diagnosis would require him to change his career, and the response that is no. His career will have no bearing on his diagnosis and he can continue with flying. He is a Occupational hygienist for [employer]. He does not take a long trips with the Corporation they're usually 1-2 days so this should not interfere with his INR laboratory work.  The patient denies any complaints. We discussed briefly his smoking history which is a one pack per day smoking history for 15 years "on and off".  Past Medical History  Diagnosis Date  . Portal vein thrombosis   . 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 Lupus anticoagulant positive 10/06/2011  . Hypercoagulable state 10/06/2011    has Anticardiolipin antibody positive; Lupus anticoagulant positive; and Hypercoagulable state on his problem list.      has no known allergies.  Eric Becker had no medications administered during this visit.  Past Surgical History  Procedure Date  . Colon surgery     Denies any headaches, dizziness, double vision, fevers, chills, night sweats, nausea, vomiting, diarrhea, constipation, chest pain, heart palpitations, shortness of breath, blood in stool, black tarry stool, urinary pain, urinary burning, urinary frequency, hematuria.   PHYSICAL EXAMINATION  ECOG PERFORMANCE STATUS: 0 - Asymptomatic  Filed Vitals:   10/06/11 1431  BP: 126/76  Pulse: 86  Temp: 98.1 F (36.7 C)    GENERAL:alert, healthy, no distress, well nourished, well developed, comfortable, cooperative and smiling SKIN: skin color, texture, turgor are normal, no rashes or significant lesions HEAD: Normocephalic, No masses, lesions, tenderness or abnormalities EYES: normal, PERRLA, EOMI, Conjunctiva  are pink and non-injected EARS: External ears normal OROPHARYNX:lips, buccal mucosa, and tongue normal  NECK: supple, trachea midline LYMPH:  not examined BREAST:not  examined LUNGS: clear to auscultation and percussion HEART: regular rate & rhythm, no murmurs, no gallops, S1 normal and S2 normal ABDOMEN:abdomen soft, non-tender and normal bowel sounds BACK: Back symmetric, no curvature., No CVA tenderness EXTREMITIES:less then 2 second capillary refill, no joint deformities, effusion, or inflammation, no edema, no skin discoloration, no clubbing, no cyanosis  NEURO: alert & oriented x 3 with fluent speech, no focal motor/sensory deficits, gait normal   LABORATORY DATA: Results for Eric Becker, Eric Becker (MRN [id number]) as of 10/06/2011 17:04  Ref. Range 09/25/2011 15:45  Anticardiolipin IgA Latest Range: <22 APL U/mL 8 (L)  Anticardiolipin IgG Latest Range: <23 GPL U/mL 7 (L)  Anticardiolipin IgM Latest Range: <11 MPL U/mL 21 (H)  PTT Lupus Anticoagulant Latest Range: 28.0-43.0 secs 48.4 (H)  PTTLA Confirmation Latest Range: <8.0 secs 21.4 (H)  PTTLA 4:1 Mix Latest Range: 28.0-43.0 secs 50.7 (H)  DRVVT Latest Range: <45.1 secs 66.6 (H)  Drvvt confirmation Latest Range: <1.16 Ratio NOT APPL  dRVVT Incubated 1:1 Mix Latest Range: <45.1 secs 40.7  Lupus Anticoagulant Latest Range: NOT DETECTED  DETECTED (A)  Beta-2 Glyco I IgG Latest Range: <20 G Units 0  Beta-2-Glycoprotein I IgA Latest Range: <20 A Units 6  Beta-2-Glycoprotein I IgM Latest Range: <20 M Units 53 (H)  AntiThromb III Func Latest Range: 75-120 % 75  D-Dimer, Quant Latest Range: 0.00-0.48 ug/mL-FEU 4.89 (H)  Fibrinogen Latest Range: 204-475 mg/dL 253 (H)  [id number]: No range found (NOTE)  Protein C Activity Latest Range: 75-133 % 157 (H)  Protein C, Total Latest Range: 72-160 % 89  Protein S Activity Latest Range: 69-129 % 70  Protein S Ag, Total Latest Range: 60-150 % 81  Prothrombin Time Latest Range: 11.6-15.2 seconds 13.9  INR Latest Range: 0.00-1.49  1.05  aPTT Latest Range: 24-37 seconds 39 (H)    RADIOGRAPHIC STUDIES:  09/25/2011  *RADIOLOGY REPORT*  Clinical  Data: Evaluate acute versus chronic portal vein thrombosis  found on ultrasound. Evaluate for cirrhosis, occult malignancy.  CT ABDOMEN AND PELVIS WITH CONTRAST  Technique: Multidetector CT imaging of the abdomen and pelvis was  performed following the standard protocol during bolus  administration of intravenous contrast.  Contrast: OMNIPAQUE IOHEXOL 300 MG/ML SOLN  Comparison: Ultrasound of the abdomen 09/25/2011  Findings: Images of the lung bases are unremarkable. Heart size is  normal.  There is occlusion of the right portal vein, at the confluence of  the left right portal veins, as seen on previous ultrasound exam.  No evidence for hepatic lesion. Prominent caudate lobe. Liver  contours otherwise normal. There is thrombus within the superior  mesenteric vein extending into the branches of the mesenteric vein.  There is stranding within the mesentery.  No focal abnormality identified within the spleen, pancreas,  adrenal glands, or kidneys. The gallbladder is present. Small  midline abdominal hernias containing mesenteric fat above the  umbilicus. The stomach and small bowel loops have a normal  appearance. The appendix is not well seen. However, no focal  inflammatory changes are seen to suggest presence of acute  appendicitis. Colonic loops are normal in appearance.  IMPRESSION:  1. Right portal vein thrombosis.  2. Thrombosis of the superior mesenteric vein and its branches,  associated mesenteric edema/congestion.  3. Small, nonobstructed midline abdominal hernias.  4. No evidence for malignancy.  5. Prominent caudate  lobe which can be associated with cirrhotic  changes.  The findings were discussed with Dr. Felecia Shelling on 09/25/2011 at 7:45  p.m.  Original Report Authenticated By: Patterson Hammersmith, M.D.    ASSESSMENT:  1. Hypercoagulable state: anti-cardiolipin IgM antibody, lupus anticoagulant, and a beta-2 glycoprotein 1 IgM. 2. Portal vein thrombosis 3.  Mesenteric vein thrombosis 4. Family history in brother of antiphospholipid antibody syndrome    PLAN:  1. Recommend lifelong anticoagulation with Coumadin. 2. Offered a second opinion at the [facility] with Dr. Isaiah Serge or Dr. Marcheta Grammes.  Patient declined. 3. Follow-up with PCP regarding INR checks 4. Return to the clinic on a PRN basis.   All questions were answered. The patient knows to call the clinic with any problems, questions or concerns. We can certainly see the patient much sooner if necessary.  The patient and plan discussed with Glenford Peers, MD and he is in agreement with the aforementioned.  Patient seen and examined by Dr. Glenford Peers as well.  KEFALAS,THOMAS

## 2013-06-23 ENCOUNTER — Emergency Department (HOSPITAL_COMMUNITY)
Admission: EM | Admit: 2013-06-23 | Discharge: 2013-06-24 | Disposition: A | Discharge status: Home / Self Care | Payer: BC Managed Care – PPO | Attending: Emergency Medicine | Admitting: Emergency Medicine

## 2013-06-23 ENCOUNTER — Emergency Department (HOSPITAL_COMMUNITY): Payer: BC Managed Care – PPO

## 2013-06-23 ENCOUNTER — Encounter (HOSPITAL_COMMUNITY): Payer: Self-pay | Admitting: Emergency Medicine

## 2013-06-23 DIAGNOSIS — Z862 Personal history of diseases of the blood and blood-forming organs and certain disorders involving the immune mechanism: Secondary | ICD-10-CM | POA: Insufficient documentation

## 2013-06-23 DIAGNOSIS — R042 Hemoptysis: Secondary | ICD-10-CM

## 2013-06-23 DIAGNOSIS — R059 Cough, unspecified: Secondary | ICD-10-CM | POA: Insufficient documentation

## 2013-06-23 DIAGNOSIS — R05 Cough: Secondary | ICD-10-CM | POA: Insufficient documentation

## 2013-06-23 DIAGNOSIS — Z87891 Personal history of nicotine dependence: Secondary | ICD-10-CM | POA: Insufficient documentation

## 2013-06-23 DIAGNOSIS — Z7901 Long term (current) use of anticoagulants: Secondary | ICD-10-CM | POA: Insufficient documentation

## 2013-06-23 DIAGNOSIS — Z86718 Personal history of other venous thrombosis and embolism: Secondary | ICD-10-CM | POA: Insufficient documentation

## 2013-06-23 LAB — CBC WITH DIFFERENTIAL/PLATELET
BASOS PCT: 0 % (ref 0–1)
Basophils Absolute: 0 10*3/uL (ref 0.0–0.1)
EOS ABS: 0.1 10*3/uL (ref 0.0–0.7)
Eosinophils Relative: 1 % (ref 0–5)
HEMATOCRIT: 43.9 % (ref 39.0–52.0)
HEMOGLOBIN: 14.7 g/dL (ref 13.0–17.0)
LYMPHS ABS: 3.5 10*3/uL (ref 0.7–4.0)
Lymphocytes Relative: 32 % (ref 12–46)
MCH: 30.6 pg (ref 26.0–34.0)
MCHC: 33.5 g/dL (ref 30.0–36.0)
MCV: 91.3 fL (ref 78.0–100.0)
MONO ABS: 0.7 10*3/uL (ref 0.1–1.0)
MONOS PCT: 7 % (ref 3–12)
NEUTROS ABS: 6.3 10*3/uL (ref 1.7–7.7)
Neutrophils Relative %: 60 % (ref 43–77)
Platelets: 210 10*3/uL (ref 150–400)
RBC: 4.81 MIL/uL (ref 4.22–5.81)
RDW: 13.3 % (ref 11.5–15.5)
WBC: 10.7 10*3/uL — ABNORMAL HIGH (ref 4.0–10.5)

## 2013-06-23 LAB — HEPATIC FUNCTION PANEL
ALBUMIN: 4 g/dL (ref 3.5–5.2)
ALK PHOS: 40 U/L (ref 39–117)
ALT: 15 U/L (ref 0–53)
AST: 22 U/L (ref 0–37)
BILIRUBIN TOTAL: 0.2 mg/dL — AB (ref 0.3–1.2)
Bilirubin, Direct: <0.2 mg/dL (ref 0.0–0.3)
Total Protein: 7.5 g/dL (ref 6.0–8.3)

## 2013-06-23 LAB — PROTIME-INR
INR: 2.21 — ABNORMAL HIGH (ref 0.00–1.49)
Prothrombin Time: 23.8 seconds — ABNORMAL HIGH (ref 11.6–15.2)

## 2013-06-23 LAB — BASIC METABOLIC PANEL
BUN: 18 mg/dL (ref 6–23)
CHLORIDE: 101 meq/L (ref 96–112)
CO2: 27 mEq/L (ref 19–32)
CREATININE: 0.95 mg/dL (ref 0.50–1.35)
Calcium: 9.5 mg/dL (ref 8.4–10.5)
GFR calc non Af Amer: >90 mL/min (ref >90)
GLUCOSE: 113 mg/dL — AB (ref 70–99)
Potassium: 4.3 mEq/L (ref 3.7–5.3)
Sodium: 141 mEq/L (ref 137–147)

## 2013-06-23 MED ORDER — SODIUM CHLORIDE 0.9 % IV BOLUS (SEPSIS)
1000.0000 mL | Freq: Once | INTRAVENOUS | Status: AC
  Administered 2013-06-23: 1000 mL via INTRAVENOUS

## 2013-06-23 MED ORDER — IOHEXOL 300 MG/ML  SOLN
100.0000 mL | Freq: Once | INTRAMUSCULAR | Status: AC | PRN
  Administered 2013-06-23: 100 mL via INTRAVENOUS

## 2013-06-23 NOTE — Discharge Instructions (Signed)
Follow up next week with your md.  Do not take your coumadin sat or sun.  Return if problems

## 2013-06-23 NOTE — ED Notes (Signed)
Started spitting up blood around 30 minutes ago per pt. I'm on coumadin per pt.

## 2013-06-23 NOTE — ED Provider Notes (Signed)
CSN: [id number]     Arrival date & time 06/23/13  2017 History  This chart was scribed for Eric LennertJoseph L Jaynell Castagnola, MD by Ardelia Memsylan Malpass, ED Scribe. This patient was seen in room APA16A/APA16A and the patient's care was started at 8:28 PM.   Chief Complaint  Patient presents with  . Hemoptysis    Patient is a 58 y.o. male presenting with cough. The history is provided by the patient. No language interpreter was used.  Cough Cough characteristics: hemoptysis- coughed up small amounts of blood 6-7 times. Severity:  Moderate Onset quality:  Gradual Duration:  1 hour Timing:  Intermittent Progression:  Unchanged Chronicity:  New Smoker: no   Relieved by:  None tried Worsened by:  Nothing tried Ineffective treatments:  None tried Associated symptoms: no chest pain, no chills, no eye discharge, no fever, no headaches, no rash and no shortness of breath     HPI Comments: Eric Becker is a 58 y.o. male who presents to the Emergency Department complaining of hemoptysis that began about 30 minutes ago. He states that he has coughed up small amounts of blood 6-7 times. Pt states that he is on Warfarin. He denies any associated SOB, fever, chills or any other symptoms. Pt is a former smoker.   Past Medical History  Diagnosis Date  . Portal vein thrombosis   . 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 Lupus anticoagulant positive 10/06/2011  . Hypercoagulable state 10/06/2011   Past Surgical History  Procedure Laterality Date  . Colon surgery     Family History  Problem Relation Age of Onset  . Cancer Father     lung cancer   History  Substance Use Topics  . Smoking status: Former Smoker    Quit date: 02/16/2011  . Smokeless tobacco: Never Used  . Alcohol Use: Yes     Comment: occ    Review of Systems  Constitutional: Negative for fever, chills, appetite change and fatigue.  HENT: Negative for congestion, ear discharge and sinus pressure.   Eyes: Negative for discharge.   Respiratory: Positive for cough. Negative for shortness of breath.   Cardiovascular: Negative for chest pain.  Gastrointestinal: Negative for abdominal pain and diarrhea.  Genitourinary: Negative for frequency and hematuria.  Musculoskeletal: Negative for back pain.  Skin: Negative for rash.  Neurological: Negative for seizures and headaches.  Psychiatric/Behavioral: Negative for hallucinations.    Allergies  Review of patient's allergies indicates no known allergies.  Home Medications   Prior to Admission medications   Medication Sig Start Date End Date Taking? Authorizing Provider  warfarin (COUMADIN) 5 MG tablet Take 5 mg by mouth daily. 5mg  daily/managed by Dr.Knowlton 09/27/11   Milana ObeyStephen D Knowlton, MD   Triage Vitals: BP 146/92  Pulse 116  Temp(Src) 98 F (36.7 C) (Oral)  Resp 20  Ht 5' 10.5" (1.791 m)  Wt 208 lb (94.348 kg)  BMI 29.41 kg/m2  SpO2 97%  Physical Exam  Nursing note and vitals reviewed. Constitutional: He is oriented to person, place, and time. He appears well-developed.  HENT:  Head: Normocephalic.  Eyes: Conjunctivae and EOM are normal. No scleral icterus.  Neck: Neck supple. No thyromegaly present.  Cardiovascular: Normal rate and regular rhythm.  Exam reveals no gallop and no friction rub.   No murmur heard. Pulmonary/Chest: No stridor. He has no wheezes. He has no rales. He exhibits no tenderness.  Abdominal: He exhibits no distension. There is no tenderness. There is no rebound.  Musculoskeletal: Normal range of  motion. He exhibits no edema.  Lymphadenopathy:    He has no cervical adenopathy.  Neurological: He is oriented to person, place, and time. He exhibits normal muscle tone. Coordination normal.  Skin: No rash noted. No erythema.  Psychiatric: He has a normal mood and affect. His behavior is normal.    ED Course  Procedures (including critical care time)  DIAGNOSTIC STUDIES: Oxygen Saturation is 97% on RA, normal by my  interpretation.    COORDINATION OF CARE: 8:33 PM- Discussed plan to obtain diagnostic lab work and radiology. Will also order IV fluids. Pt advised of plan for treatment and pt agrees.  Medications  sodium chloride 0.9 % bolus 1,000 mL (1,000 mLs Intravenous New Bag/Given 06/23/13 2049)   Labs Review Labs Reviewed  CBC WITH DIFFERENTIAL  BASIC METABOLIC PANEL  PROTIME-INR  HEPATIC FUNCTION PANEL   Imaging Review No results found.   EKG Interpretation None      MDM   Final diagnoses:  None    Hemoptysis.   Pt has only coughed up blood once in the last 2 hours and it was a small amount.  He is to hold his coumadin and follow up next week.   The chart was scribed for me under my direct supervision.  I personally performed the history, physical, and medical decision making and all procedures in the evaluation of this patient.Eric Becker.   Eric Kinley L Celise Bazar, MD 06/23/13 [phone]

## 2014-12-31 IMAGING — CT CT CHEST W/ CM
2 of 3 series · 15 of 36 positions shown, 18 images · IV contrast (omnipaque)
Comparison: 06/23/2013 chest x-ray.

CLINICAL DATA: Hemoptysis.  On blood thinners.

EXAM:
CT CHEST WITH CONTRAST
TECHNIQUE: Multidetector CT imaging of the chest was performed during
intravenous contrast administration.
CONTRAST:  100mL OMNIPAQUE IOHEXOL 300 MG/ML  SOLN

[Series 2: chestroutine 5.0 b40f · axial · 0.74mm/px · z∈[+839,+1139]mm · 12 of 72 slices shown, 15 images]
[im 6/72  mediastinal]
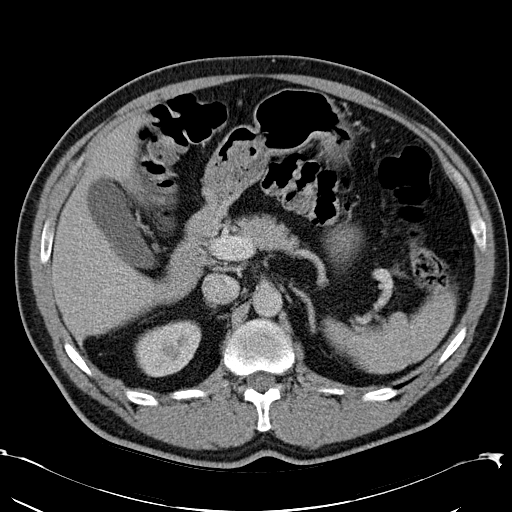
[im 6/72  lung]
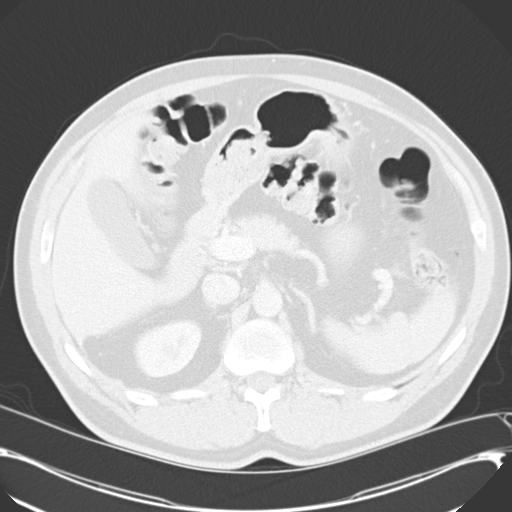
[im 11/72  lung]
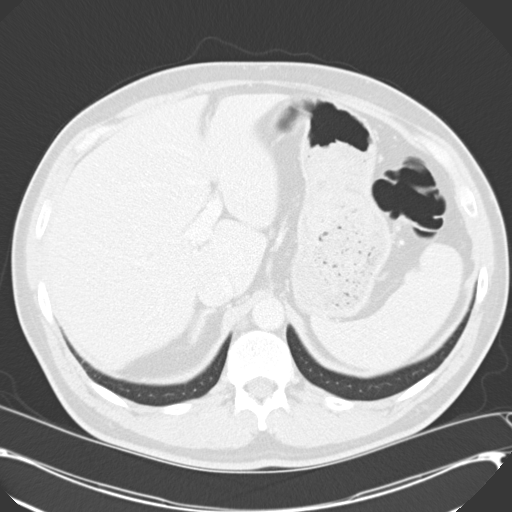
[im 16/72  lung]
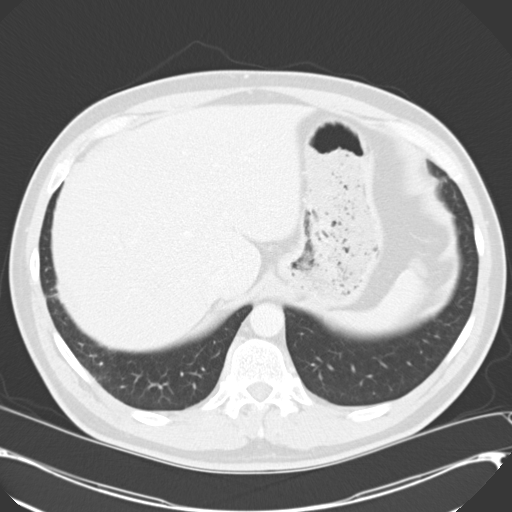
[im 22/72  lung]
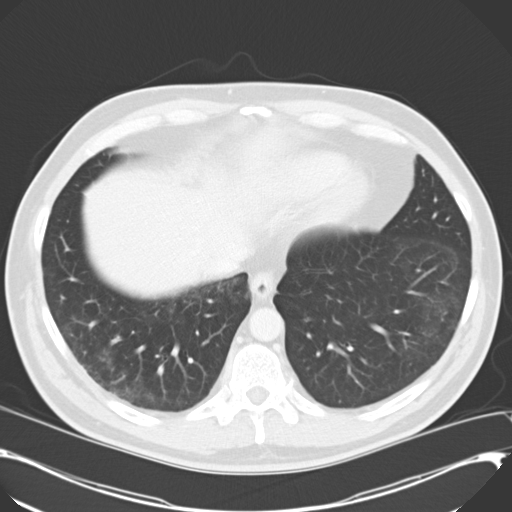
[im 27/72  mediastinal]
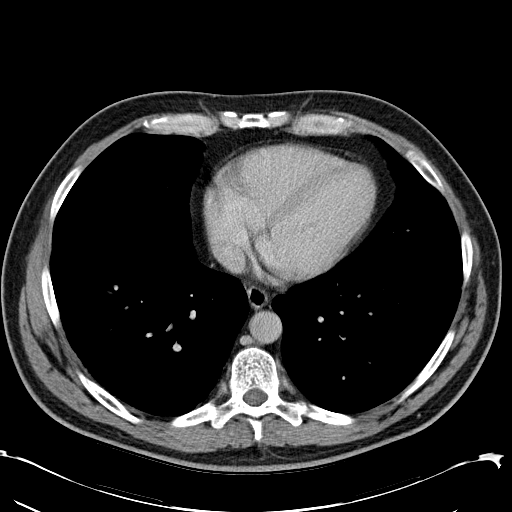
[im 27/72  lung]
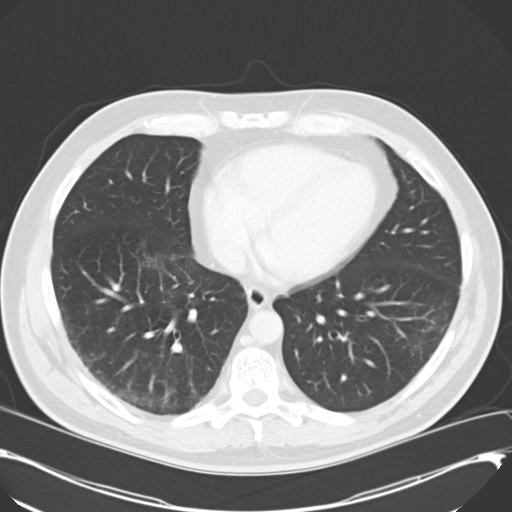
[im 32/72  lung]
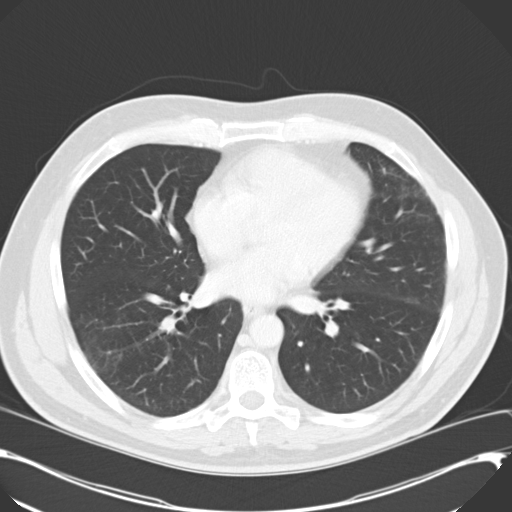
[im 40/72  lung]
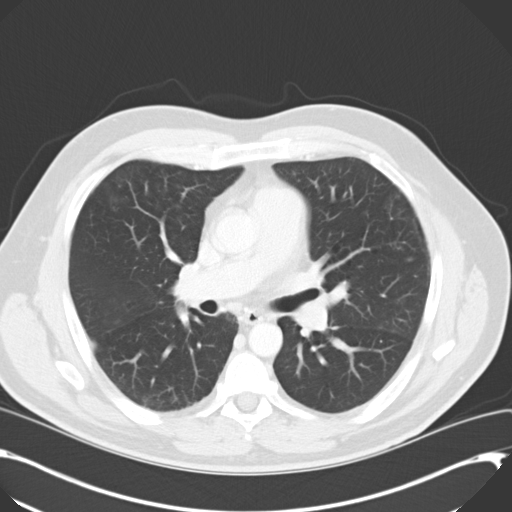
[im 45/72  lung]
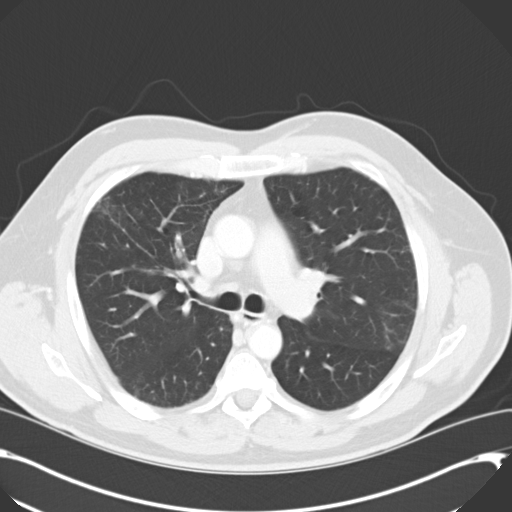
[im 50/72  mediastinal]
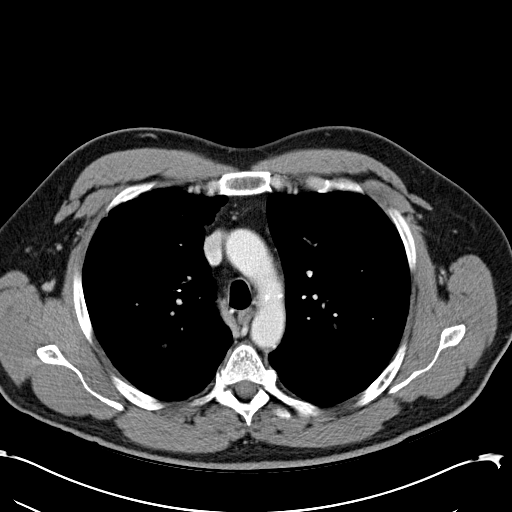
[im 50/72  lung]
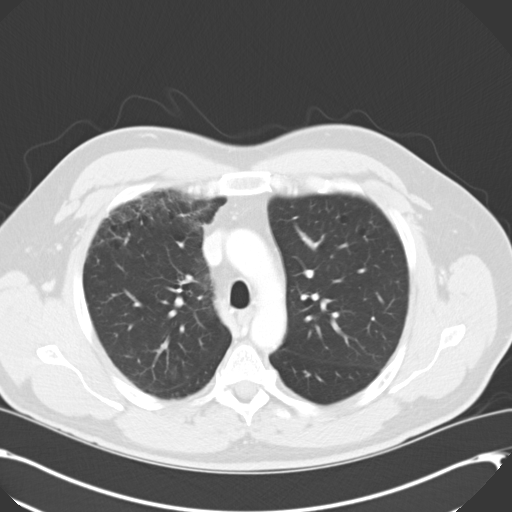
[im 56/72  lung]
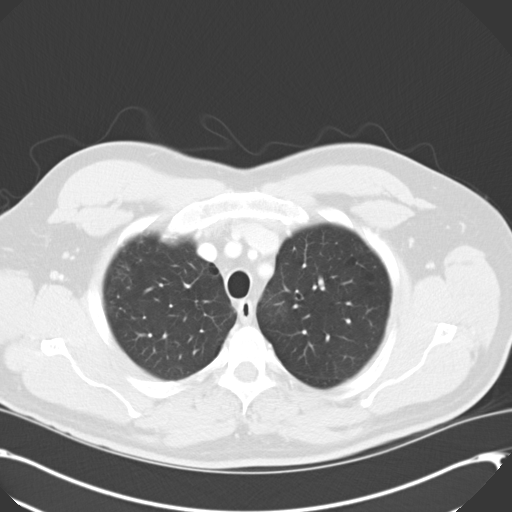
[im 61/72  lung]
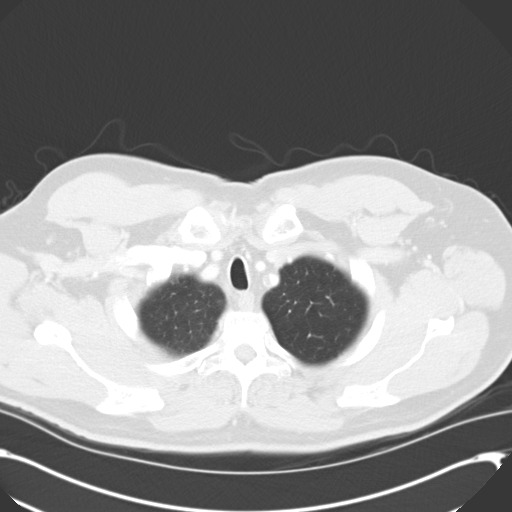
[im 66/72  lung]
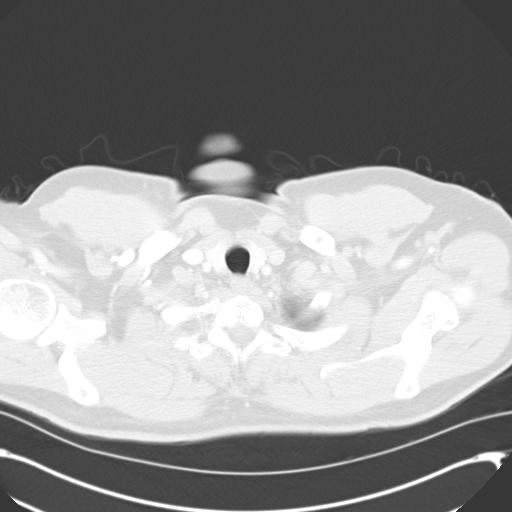

[Series 5: mpr coronal chest 3mm · coronal · 0.71mm/px · 3 of 98 slices shown]
[im 20/98  lung]
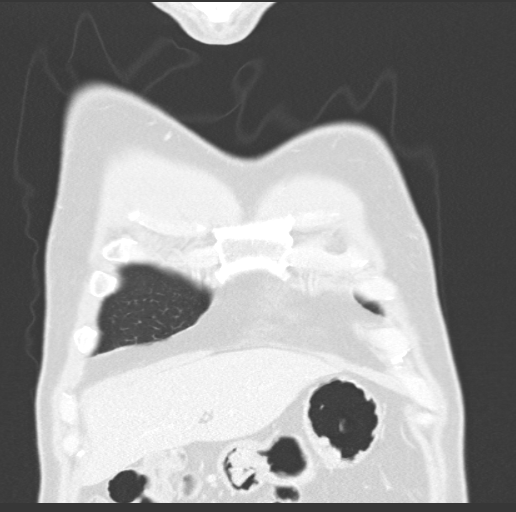
[im 39/98  lung]
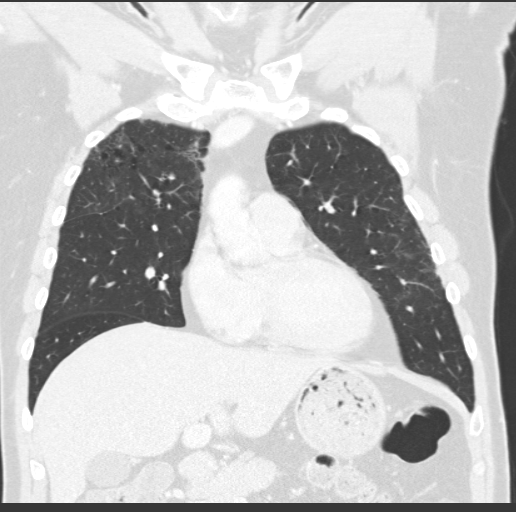
[im 59/98  lung]
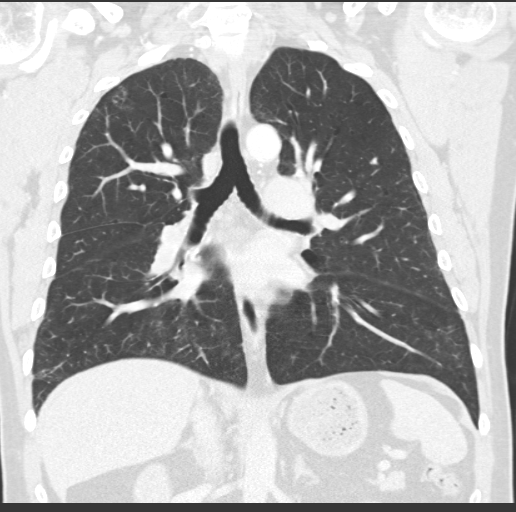

[15 of 36 positions shown; findings below may reference images not displayed]

FINDINGS: Heart is normal size. Scattered coronary artery calcifications in
the left anterior descending coronary artery. Aorta is normal
caliber.

No mediastinal, hilar, or axillary adenopathy. Chest wall soft
tissues are unremarkable.

Reticular and ground-glass densities are noted anteriorly in the
right upper lobe as well as in the lingula, likely areas of
scarring. Similar opacities are also noted in the posterior lower
lobes. No effusions.

Imaging into the upper abdomen shows no acute findings. No acute
bony abnormality.
IMPRESSION: Scattered areas of peripheral interstitial reticulation and
ground-glass opacities, likely areas of scarring.

Coronary artery disease.

## 2021-10-27 ENCOUNTER — Ambulatory Visit: Payer: Self-pay | Admitting: Cardiology
